# Patient Record
Sex: Male | Born: 1990 | Race: Black or African American | Hispanic: No | Marital: Single | Smoking: Current some day smoker
Health system: Southern US, Community
[De-identification: ages and names within clinical notes are randomized; demographics above are authoritative.]

## PROBLEM LIST (undated history)

## (undated) HISTORY — PX: TONSILLECTOMY: SUR1361

---

## 2016-12-29 ENCOUNTER — Encounter: Payer: Self-pay | Admitting: Emergency Medicine

## 2016-12-29 ENCOUNTER — Emergency Department
Admission: EM | Admit: 2016-12-29 | Discharge: 2016-12-29 | Disposition: A | Discharge status: Home / Self Care | Payer: No Typology Code available for payment source | Attending: Emergency Medicine | Admitting: Emergency Medicine

## 2016-12-29 ENCOUNTER — Emergency Department: Payer: No Typology Code available for payment source

## 2016-12-29 DIAGNOSIS — Y92410 Unspecified street and highway as the place of occurrence of the external cause: Secondary | ICD-10-CM | POA: Diagnosis not present

## 2016-12-29 DIAGNOSIS — Y9389 Activity, other specified: Secondary | ICD-10-CM | POA: Insufficient documentation

## 2016-12-29 DIAGNOSIS — S161XXA Strain of muscle, fascia and tendon at neck level, initial encounter: Secondary | ICD-10-CM | POA: Diagnosis not present

## 2016-12-29 DIAGNOSIS — Y999 Unspecified external cause status: Secondary | ICD-10-CM | POA: Insufficient documentation

## 2016-12-29 DIAGNOSIS — F17200 Nicotine dependence, unspecified, uncomplicated: Secondary | ICD-10-CM | POA: Insufficient documentation

## 2016-12-29 DIAGNOSIS — S199XXA Unspecified injury of neck, initial encounter: Secondary | ICD-10-CM | POA: Diagnosis present

## 2016-12-29 DIAGNOSIS — M7918 Myalgia, other site: Secondary | ICD-10-CM

## 2016-12-29 MED ORDER — HYDROMORPHONE HCL 1 MG/ML IJ SOLN: 1.0000 mg | Freq: Once | INTRAMUSCULAR | Status: DC

## 2016-12-29 MED ORDER — CYCLOBENZAPRINE HCL 10 MG PO TABS: 10.0000 mg | ORAL_TABLET | Freq: Three times a day (TID) | ORAL | 0 refills | Status: AC | PRN

## 2016-12-29 MED ORDER — KETOROLAC TROMETHAMINE 10 MG PO TABS: 10.0000 mg | ORAL_TABLET | Freq: Four times a day (QID) | ORAL | 0 refills | Status: AC | PRN

## 2016-12-29 MED ORDER — ORPHENADRINE CITRATE 30 MG/ML IJ SOLN
60.0000 mg | Freq: Two times a day (BID) | INTRAMUSCULAR | Status: DC
  Administered 2016-12-29: 60 mg via INTRAMUSCULAR
  Filled 2016-12-29: qty 2

## 2016-12-29 MED ORDER — KETOROLAC TROMETHAMINE 60 MG/2ML IM SOLN: 30.0000 mg | Freq: Once | INTRAMUSCULAR | Status: DC

## 2016-12-29 MED ORDER — KETOROLAC TROMETHAMINE 60 MG/2ML IM SOLN
60.0000 mg | Freq: Once | INTRAMUSCULAR | Status: AC
  Administered 2016-12-29: 60 mg via INTRAMUSCULAR
  Filled 2016-12-29: qty 2

## 2016-12-29 NOTE — ED Notes (Signed)
Per family he was on the way to a [facility] year rehab   Per mother he needs clearance.
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
## 2016-12-29 NOTE — ED Notes (Signed)
Mother called at this time, will be back to pick up pt
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
## 2016-12-29 NOTE — ED Notes (Signed)
Waiting for mother to arrive in order to discharge pt

## 2016-12-29 NOTE — ED Notes (Signed)
Mother at bedside, discharge went over with both pt and mother. Paperwork given to mother along with prescriptions
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
